# Patient Record
Sex: Male | Born: 1965 | Race: White | Hispanic: No | Marital: Married | Smoking: Current every day smoker
Health system: Southern US, Community
[De-identification: ages and names within clinical notes are randomized; demographics above are authoritative.]

## PROBLEM LIST (undated history)

## (undated) DIAGNOSIS — K30 Functional dyspepsia: Secondary | ICD-10-CM

## (undated) DIAGNOSIS — I10 Essential (primary) hypertension: Secondary | ICD-10-CM

## (undated) DIAGNOSIS — E785 Hyperlipidemia, unspecified: Secondary | ICD-10-CM

## (undated) HISTORY — DX: Hyperlipidemia, unspecified: E78.5

## (undated) HISTORY — DX: Functional dyspepsia: K30

## (undated) HISTORY — DX: Essential (primary) hypertension: I10

---

## 2017-09-26 ENCOUNTER — Emergency Department (HOSPITAL_BASED_OUTPATIENT_CLINIC_OR_DEPARTMENT_OTHER): Payer: No Typology Code available for payment source

## 2017-09-26 ENCOUNTER — Other Ambulatory Visit: Payer: Self-pay

## 2017-09-26 ENCOUNTER — Emergency Department (HOSPITAL_BASED_OUTPATIENT_CLINIC_OR_DEPARTMENT_OTHER)
Admission: EM | Admit: 2017-09-26 | Discharge: 2017-09-26 | Disposition: A | Discharge status: Home / Self Care | Payer: No Typology Code available for payment source | Attending: Emergency Medicine | Admitting: Emergency Medicine

## 2017-09-26 ENCOUNTER — Encounter (HOSPITAL_BASED_OUTPATIENT_CLINIC_OR_DEPARTMENT_OTHER): Payer: Self-pay | Admitting: *Deleted

## 2017-09-26 DIAGNOSIS — Y9389 Activity, other specified: Secondary | ICD-10-CM | POA: Diagnosis not present

## 2017-09-26 DIAGNOSIS — F17210 Nicotine dependence, cigarettes, uncomplicated: Secondary | ICD-10-CM | POA: Diagnosis not present

## 2017-09-26 DIAGNOSIS — S93402A Sprain of unspecified ligament of left ankle, initial encounter: Secondary | ICD-10-CM | POA: Diagnosis not present

## 2017-09-26 DIAGNOSIS — Y929 Unspecified place or not applicable: Secondary | ICD-10-CM | POA: Insufficient documentation

## 2017-09-26 DIAGNOSIS — Y999 Unspecified external cause status: Secondary | ICD-10-CM | POA: Diagnosis not present

## 2017-09-26 DIAGNOSIS — X500XXA Overexertion from strenuous movement or load, initial encounter: Secondary | ICD-10-CM | POA: Diagnosis not present

## 2017-09-26 DIAGNOSIS — S99912A Unspecified injury of left ankle, initial encounter: Secondary | ICD-10-CM | POA: Diagnosis present

## 2017-09-26 MED ORDER — IBUPROFEN 800 MG PO TABS
800.0000 mg | ORAL_TABLET | Freq: Once | ORAL | Status: AC
  Administered 2017-09-26: 800 mg via ORAL
  Filled 2017-09-26: qty 1

## 2017-09-26 MED ORDER — IBUPROFEN 800 MG PO TABS: 800.0000 mg | ORAL_TABLET | Freq: Three times a day (TID) | ORAL | 0 refills | Status: DC

## 2017-09-26 NOTE — ED Triage Notes (Signed)
Pt reports turning his left ankle after stepping onto trampoline last night. Denies any other injury or c/o. Left ankle is swollen, pain on with wb.

## 2017-09-26 NOTE — ED Provider Notes (Signed)
Emergency Department Provider Note   I have reviewed the triage vital signs and the nursing notes.   HISTORY  Chief Complaint Ankle Pain   HPI Lance Steele is a 52 y.o. male who was trying to help someone else off of a trampoline last night when he rolled his left ankle caused initial pain and swelling and bruising.  Ice did overnight and took some Tylenol but was persistently swollen and painful this morning so he came here for evaluation.  No other injuries. No other associated or modifying symptoms.    History reviewed. No pertinent past medical history.  There are no active problems to display for this patient.   History reviewed. No pertinent surgical history.    Allergies Patient has no known allergies.  History reviewed. No pertinent family history.  Social History Social History   Tobacco Use  . Smoking status: Current Every Day Smoker  . Smokeless tobacco: Never Used  Substance Use Topics  . Alcohol use: Not Currently  . Drug use: Not on file    Review of Systems  All other systems negative except as documented in the HPI. All pertinent positives and negatives as reviewed in the HPI. ____________________________________________   PHYSICAL EXAM:  VITAL SIGNS: ED Triage Vitals  Enc Vitals Group     BP 09/26/17 0841 (!) 136/94     Pulse Rate 09/26/17 0841 94     Resp 09/26/17 0841 18     Temp 09/26/17 0841 98 F (36.7 C)     Temp Source 09/26/17 0841 Oral     SpO2 09/26/17 0841 97 %     Weight 09/26/17 0841 193 lb (87.5 kg)     Height 09/26/17 0841 5\' 11"  (1.803 m)    Constitutional: Alert and oriented. Well appearing and in no acute distress. Eyes: Conjunctivae are normal. PERRL. EOMI. Head: Atraumatic. Nose: No congestion/rhinnorhea. Mouth/Throat: Mucous membranes are moist.  Oropharynx non-erythematous. Neck: No stridor.  No meningeal signs.   Cardiovascular: Normal rate, regular rhythm. Good peripheral circulation. Grossly normal  heart sounds.   Respiratory: Normal respiratory effort.  No retractions. Lungs CTAB. Gastrointestinal: Soft and nontender. No distention.  Musculoskeletal: Left ankle swelling with mild ecchymosis behind the lateral malleolus.  Pain with range of motion but stable.  Neurovascularly intact. Neurologic:  Normal speech and language. No gross focal neurologic deficits are appreciated.  Skin:  Skin is warm, dry and intact. No rash noted.   ____________________________________________   RADIOLOGY  Dg Ankle Complete Left  Result Date: 09/26/2017 CLINICAL DATA:  Trampoline injury with ankle pain, initial encounter EXAM: LEFT ANKLE COMPLETE - 3+ VIEW COMPARISON:  None. FINDINGS: Diffuse soft tissue swelling is noted slightly greater laterally than medially. Well corticated densities are noted adjacent to the medial and lateral malleolus consistent with prior trauma. No definitive acute fracture is seen. IMPRESSION: Considerable soft tissue swelling without acute bony abnormality. Changes of prior trauma are noted. Electronically Signed   By: Alcide Clever M.D.   On: 09/26/2017 09:19    ____________________________________________   INITIAL IMPRESSION / ASSESSMENT AND PLAN / ED COURSE  52 yo M who presents to ED with L ankle pain and swelling after fall with inversion. On exam has pain with ROM, slight swelling, pain with palpation of malleolus. Difficulty bearing weight for more than a couple steps. So XR done to evaluate for fracture and negative. No proximal fibular ttp to suggest fracture or unstable injury. No laxity in ankle to suggest severe grade 3 sprain.  Plan for ace wrap. NSAIDs for pain. Will also employ RICE therapy and weight bearing as tolerated. If symptoms not improving in one week, will plan to follow up with PCP or orthopedics for repeat imaging to evaluate for occult fracture.   I discussed with the patient the possibility of a missed fracture. I told them that sometimes they are  not seen on initial x-ray but if they had persistent pain for 5 to 7 days they would need to follow-up with their primary doctor or an orthopedic doctor to get repeat evaluation and x-ray to evaluate for missed fracture or ligamentous injury and they stated understanding. Will employ RICE therapy with NSAIDs/tylenol in mean time.   Pertinent labs & imaging results that were available during my care of the patient were reviewed by me and considered in my medical decision making (see chart for details).  ____________________________________________  FINAL CLINICAL IMPRESSION(S) / ED DIAGNOSES  Final diagnoses:  Sprain of left ankle, unspecified ligament, initial encounter     MEDICATIONS GIVEN DURING THIS VISIT:  Medications  ibuprofen (ADVIL,MOTRIN) tablet 800 mg (800 mg Oral Given 09/26/17 0903)     NEW OUTPATIENT MEDICATIONS STARTED DURING THIS VISIT:  New Prescriptions   IBUPROFEN (ADVIL,MOTRIN) 800 MG TABLET    Take 1 tablet (800 mg total) by mouth 3 (three) times daily.    Note:  This note was prepared with assistance of Dragon voice recognition software. Occasional wrong-word or sound-a-like substitutions may have occurred due to the inherent limitations of voice recognition software.   Alois Mincer, Barbara CowerJason, MD 09/26/17 [phone]

## 2018-01-07 ENCOUNTER — Ambulatory Visit
Admission: RE | Admit: 2018-01-07 | Discharge: 2018-01-07 | Disposition: A | Discharge status: Home / Self Care | Payer: No Typology Code available for payment source | Source: Ambulatory Visit | Attending: Family Medicine | Admitting: Family Medicine

## 2018-01-07 ENCOUNTER — Other Ambulatory Visit: Payer: Self-pay | Admitting: Family Medicine

## 2018-01-07 DIAGNOSIS — R05 Cough: Secondary | ICD-10-CM

## 2018-01-07 DIAGNOSIS — R053 Chronic cough: Secondary | ICD-10-CM

## 2018-03-25 ENCOUNTER — Encounter: Payer: Self-pay | Admitting: Internal Medicine

## 2018-03-25 ENCOUNTER — Ambulatory Visit (INDEPENDENT_AMBULATORY_CARE_PROVIDER_SITE_OTHER): Payer: No Typology Code available for payment source | Admitting: Internal Medicine

## 2018-03-25 VITALS — BP 114/80 | HR 90 | Ht 70.0 in | Wt 199.0 lb

## 2018-03-25 DIAGNOSIS — F17210 Nicotine dependence, cigarettes, uncomplicated: Secondary | ICD-10-CM | POA: Diagnosis not present

## 2018-03-25 DIAGNOSIS — J45991 Cough variant asthma: Secondary | ICD-10-CM | POA: Diagnosis not present

## 2018-03-25 MED ORDER — BUDESONIDE-FORMOTEROL FUMARATE 160-4.5 MCG/ACT IN AERO: 2.0000 | INHALATION_SPRAY | Freq: Two times a day (BID) | RESPIRATORY_TRACT | 11 refills | Status: DC

## 2018-03-25 MED ORDER — BUDESONIDE-FORMOTEROL FUMARATE 160-4.5 MCG/ACT IN AERO: 2.0000 | INHALATION_SPRAY | Freq: Two times a day (BID) | RESPIRATORY_TRACT | 0 refills | Status: DC

## 2018-03-25 NOTE — Assessment & Plan Note (Signed)
Counseled re importance of smoking cessation but did not meet time criteria for separate billing       I reviewed the Fletcher curve with the patient that basically indicates  if you quit smoking when your best day FEV1 is still well preserved (as is clearly  the case here)  it is highly unlikely you will progress to severe disease and informed the patient there was  no medication on the market that has proven to alter the curve/ its downward trajectory  or the likelihood of progression of their disease(unlike other chronic medical conditions such as atheroclerosis where we do think we can change the natural hx with risk reducing meds)    Therefore stopping smoking and maintaining abstinence are  the most important aspects of his care, not choice of inhalers or for that matter, doctors.   Treatment other than smoking cessation  is entirely directed by severity of symptoms and focused also on reducing exacerbations, not attempting to change the natural history of the disease.    See cough variant asthma for treating his intermittent symptoms  >>> Pulmonary f/u can be prn     Total time devoted to counseling  > 50 % of initial 60 min office visit:  review case with pt/ discussion of options/alternatives/ personally creating written customized instructions  in presence of pt  then going over those specific  Instructions directly with the pt including how to use all of the meds but in particular covering each new medication in detail and the difference between the maintenance= "automatic" meds and the prns using an action plan format for the latter (If this problem/symptom => do that organization reading Left to right).  Please see AVS from this visit for a full list of these instructions which I personally wrote for this pt and  are unique to this visit.   See device teaching which extended face to face time for this visit

## 2018-03-25 NOTE — Assessment & Plan Note (Signed)
03/25/2018  After extensive coaching inhaler device,  effectiveness =    90% >  Symbicort 160  2 bid prn   Based on two studies from NEJM  378; 20 p 1865 (2018) and 380 : p2020-30 (2019) in pts with mild asthma it is reasonable to use low dose symbicort eg 80 2bid "prn" flare in this setting but I emphasized this was only shown with symbicort and takes advantage of the rapid onset of action but is not the same as "rescue therapy" but can be stopped once the acute symptoms have resolved and the need for rescue has been minimized (< 2 x weekly)   Since he's still smoking and wants to use a prn reasonable to use symb 160 initially but if does great than change to the 80 2bid   - other option is just to stay on breo as maint.

## 2018-03-25 NOTE — Progress Notes (Signed)
Lance BurnJohn Venard, male    DOB: [birth year],    MRN: [id number]   Brief patient profile:  [age] yowm active smoker with new abrupt inhaled dust while mowing late Aug /early sept 2019 with   coughing and subj wheeze and nasal congestion transiently - breathing briefly better on albuterol then started on Breo but p completed the sample noted same symptoms> Breo restarted and referred to pulmonary clinic 03/25/2018 by Dr  Erling ConteSwain with nl spirometry on initial eval p Breo w/in 12 h of study      History of Present Illness  03/25/2018  Pulmonary/ 1st office eval/Chrisa Hassan on Breo maint rx  Chief Complaint  Patient presents with  . Pulmonary Consult    Referred by Dr. Azucena CecilSwayne. Pt c/o cough and wheezing off and on since Summer 2019- started after mowing his lawn.   Dyspnea:  None now  Cough: none now  Sleep: fine / bed flat/ on side  SABA use: none    No obvious ongoing  day to day or daytime variability or assoc excess/ purulent sputum or mucus plugs or hemoptysis or cp or chest tightness, subjective wheeze or overt sinus or hb symptoms.   Sleeping as above  without nocturnal  or early am exacerbation  of respiratory  c/o's or need for noct saba. Also denies any obvious fluctuation of symptoms with weather or environmental changes or other aggravating or alleviating factors except as outlined above   No unusual exposure hx or h/o childhood pna/ asthma or knowledge of premature birth.  Current Allergies, Complete Past Medical History, Past Surgical History, Family History, and Social History were reviewed in [facility] electronic medical record.  ROS  The following are not active complaints unless bolded Hoarseness, sore throat, dysphagia, dental problems, itching, sneezing,  nasal congestion or discharge of excess mucus or purulent secretions, ear ache,   fever, chills, sweats, unintended wt loss or wt gain, classically pleuritic or exertional cp,  orthopnea pnd or arm/hand swelling  or leg swelling,  presyncope, palpitations, abdominal pain, anorexia, nausea, vomiting, diarrhea  or change in bowel habits or change in bladder habits, change in stools or change in urine, dysuria, hematuria,  rash, arthralgias, visual complaints, headache, numbness, weakness or ataxia or problems with walking or coordination,  change in mood or  memory.             No past medical history on file.  Outpatient Medications Prior to Visit  Medication Sig Dispense Refill  . Fluticasone Furoate-Vilanterol (BREO ELLIPTA IN) Inhale 1 puff into the lungs daily. Unsure of strength    . ibuprofen (ADVIL,MOTRIN) 800 MG tablet Take 1 tablet (800 mg total) by mouth 3 (three) times daily. 21 tablet 0   No facility-administered medications prior to visit.      Objective:     BP 114/80 (BP Location: Left Arm, Cuff Size: Normal)   Pulse 90   Ht 5\' 10"  (1.778 m)   Wt 199 lb (90.3 kg)   SpO2 97%   BMI 28.55 kg/m   SpO2: 97 %  RA   HEENT: nl dentition,   and oropharynx. Nl external ear canals without cough reflex - mild  bilateral non-specific turbinate edema     NECK :  without JVD/Nodes/TM/ nl carotid upstrokes bilaterally   LUNGS: no acc muscle use,  Nl contour chest which is clear to A and P bilaterally without cough on insp or exp maneuvers   CV:  RRR  no s3 or murmur or  increase in P2, and no edema   ABD:  soft and nontender with nl inspiratory excursion in the supine position. No bruits or organomegaly appreciated, bowel sounds nl  MS:  Nl gait/ ext warm without deformities, calf tenderness, cyanosis or clubbing No obvious joint restrictions   SKIN: warm and dry without lesions    NEURO:  alert, approp, nl sensorium with  no motor or cerebellar deficits apparent.     I personally reviewed images and agree with radiology impression as follows:  CXR:   01/07/18  No active cardiopulmonary disease.      Assessment   Cough variant asthma 03/25/2018  After extensive coaching inhaler device,   effectiveness =    90% >  Symbicort 160  2 bid prn   Based on two studies from NEJM  378; 20 p 1865 (2018) and 380 : p2020-30 (2019) in pts with mild asthma it is reasonable to use low dose symbicort eg 80 2bid "prn" flare in this setting but I emphasized this was only shown with symbicort and takes advantage of the rapid onset of action but is not the same as "rescue therapy" but can be stopped once the acute symptoms have resolved and the need for rescue has been minimized (< 2 x weekly)   Since he's still smoking and wants to use a prn reasonable to use symb 160 initially but if does great than change to the 80 2bid   - other option is just to stay on breo as maint.      Cigarette smoker Counseled re importance of smoking cessation but did not meet time criteria for separate billing    I reviewed the Fletcher curve with the patient that basically indicates  if you quit smoking when your best day FEV1 is still well preserved (as is clearly  the case here)  it is highly unlikely you will progress to severe disease and informed the patient there was  no medication on the market that has proven to alter the curve/ its downward trajectory  or the likelihood of progression of their disease(unlike other chronic medical conditions such as atheroclerosis where we do think we can change the natural hx with risk reducing meds)    Therefore stopping smoking and maintaining abstinence are  the most important aspects of his care, not choice of inhalers or for that matter, doctors.   Treatment other than smoking cessation  is entirely directed by severity of symptoms and focused also on reducing exacerbations, not attempting to change the natural history of the disease.    See cough variant asthma for treating his intermittent symptoms  >>> Pulmonary f/u can be prn       Total time devoted to counseling  > 50 % of initial 60 min office visit:  review case with pt/ discussion of options/alternatives/  personally creating written customized instructions  in presence of pt  then going over those specific  Instructions directly with the pt including how to use all of the meds but in particular covering each new medication in detail and the difference between the maintenance= "automatic" meds and the prns using an action plan format for the latter (If this problem/symptom => do that organization reading Left to right).  Please see AVS from this visit for a full list of these instructions which I personally wrote for this pt and  are unique to this visit.   See device teaching which extended face to face time for this visit  Sandrea Hughs, MD 03/25/2018

## 2018-03-25 NOTE — Patient Instructions (Signed)
IF having any cough/ wheeze or short of breath ok to take symbicort 160  Up to  2 puffs every 12 hours     Work on inhaler technique:  relax and gently blow all the way out then take a nice smooth deep breath back in, triggering the inhaler at same time you start breathing in.  Hold for up to 5 seconds if you can. Blow out thru nose. Rinse and gargle with water when done  Pulmonary follow up is as needed

## 2019-05-07 ENCOUNTER — Telehealth: Payer: Self-pay | Admitting: Internal Medicine

## 2019-05-07 NOTE — Telephone Encounter (Signed)
Our last ov documented that he was actively smoking but strongly rec he quit 03/2018.   congrats if he quit but all I can give the insurance company is copy of last ov.  If there was a subsequent encounter (including phone note or ov with another provider) I suggest he use that as "ammunition"

## 2019-05-07 NOTE — Telephone Encounter (Signed)
Called and spoke to pt. Pt is requesting a letter signed by Dr. Sherene Sires stating he stopped smoking in Jan 2020, this is for pt's life insurance policy. Pt requesting a call back once complete, would possibly like letter faxed.   Dr. Sherene Sires please advise. Thanks.

## 2019-05-08 NOTE — Telephone Encounter (Signed)
Called the patient and made him aware he has not been seen in clinic since 2019. The letter would have to be based on current information in chart.  Patient agreed to appointment on 05/26/19 with Dr. Sherene Sires. Letter to be issued at that time. Nothing further needed.

## 2019-05-26 ENCOUNTER — Ambulatory Visit: Payer: No Typology Code available for payment source | Admitting: Internal Medicine

## 2020-07-29 ENCOUNTER — Encounter (HOSPITAL_COMMUNITY): Payer: Self-pay

## 2020-07-29 ENCOUNTER — Emergency Department (HOSPITAL_COMMUNITY): Payer: Managed Care, Other (non HMO)

## 2020-07-29 ENCOUNTER — Emergency Department (HOSPITAL_COMMUNITY)
Admission: EM | Admit: 2020-07-29 | Discharge: 2020-07-30 | Disposition: A | Discharge status: Home / Self Care | Payer: Managed Care, Other (non HMO) | Attending: Emergency Medicine | Admitting: Emergency Medicine

## 2020-07-29 DIAGNOSIS — S060X1A Concussion with loss of consciousness of 30 minutes or less, initial encounter: Secondary | ICD-10-CM | POA: Insufficient documentation

## 2020-07-29 DIAGNOSIS — W228XXA Striking against or struck by other objects, initial encounter: Secondary | ICD-10-CM | POA: Diagnosis not present

## 2020-07-29 DIAGNOSIS — S0990XA Unspecified injury of head, initial encounter: Secondary | ICD-10-CM | POA: Diagnosis present

## 2020-07-29 LAB — COMPREHENSIVE METABOLIC PANEL
ALT: 22 U/L (ref 0–44)
AST: 23 U/L (ref 15–41)
Albumin: 4.1 g/dL (ref 3.5–5.0)
Alkaline Phosphatase: 40 U/L (ref 38–126)
Anion gap: 12 (ref 5–15)
BUN: 19 mg/dL (ref 6–20)
CO2: 18 mmol/L — ABNORMAL LOW (ref 22–32)
Calcium: 8.8 mg/dL — ABNORMAL LOW (ref 8.9–10.3)
Chloride: 104 mmol/L (ref 98–111)
Creatinine, Ser: 1.17 mg/dL (ref 0.61–1.24)
GFR, Estimated: >60 mL/min (ref >60)
Glucose, Bld: 154 mg/dL — ABNORMAL HIGH (ref 70–99)
Potassium: 3.6 mmol/L (ref 3.5–5.1)
Sodium: 134 mmol/L — ABNORMAL LOW (ref 135–145)
Total Bilirubin: 0.6 mg/dL (ref 0.3–1.2)
Total Protein: 6.4 g/dL — ABNORMAL LOW (ref 6.5–8.1)

## 2020-07-29 LAB — CBC WITH DIFFERENTIAL/PLATELET
Abs Immature Granulocytes: 0.06 10*3/uL (ref 0.00–0.07)
Basophils Absolute: 0.1 10*3/uL (ref 0.0–0.1)
Basophils Relative: 0 %
Eosinophils Absolute: 0.5 10*3/uL (ref 0.0–0.5)
Eosinophils Relative: 4 %
HCT: 44 % (ref 39.0–52.0)
Hemoglobin: 14.8 g/dL (ref 13.0–17.0)
Immature Granulocytes: 1 %
Lymphocytes Relative: 31 %
Lymphs Abs: 3.8 10*3/uL (ref 0.7–4.0)
MCH: 29 pg (ref 26.0–34.0)
MCHC: 33.6 g/dL (ref 30.0–36.0)
MCV: 86.3 fL (ref 80.0–100.0)
Monocytes Absolute: 0.7 10*3/uL (ref 0.1–1.0)
Monocytes Relative: 6 %
Neutro Abs: 7.1 10*3/uL (ref 1.7–7.7)
Neutrophils Relative %: 58 %
Platelets: 271 10*3/uL (ref 150–400)
RBC: 5.1 MIL/uL (ref 4.22–5.81)
RDW: 13.2 % (ref 11.5–15.5)
WBC: 12.2 10*3/uL — ABNORMAL HIGH (ref 4.0–10.5)
nRBC: 0 % (ref 0.0–0.2)

## 2020-07-29 LAB — TROPONIN I (HIGH SENSITIVITY): Troponin I (High Sensitivity): 3 ng/L (ref <18)

## 2020-07-29 MED ORDER — ONDANSETRON HCL 4 MG/2ML IJ SOLN
4.0000 mg | Freq: Once | INTRAMUSCULAR | Status: AC
  Administered 2020-07-29: 4 mg via INTRAVENOUS

## 2020-07-29 MED ORDER — ONDANSETRON 4 MG PO TBDP: 4.0000 mg | ORAL_TABLET | Freq: Three times a day (TID) | ORAL | 0 refills | Status: DC | PRN

## 2020-07-29 MED ORDER — ONDANSETRON HCL 4 MG/2ML IJ SOLN
INTRAMUSCULAR | Status: AC
  Filled 2020-07-29: qty 2
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
## 2020-07-29 NOTE — ED Notes (Signed)
Addendum: Patient actually roomed @ 20:25; pt inadvertently roomed prior to arriving (roomed at 20:12, actual arrival time 20:25).

## 2020-07-29 NOTE — ED Notes (Signed)
Pt ambulated in hallway and given water, tolerating well

## 2020-07-29 NOTE — ED Provider Notes (Signed)
[facility] EMERGENCY DEPARTMENT Provider Note   CSN: [id number] Arrival date & time: 07/29/20  2012     History Chief Complaint  Patient presents with  . Head Injury    Lance Steele is a 55 y.o. male.  The history is provided by the patient and medical records.   Lance Steele is a 55 y.o. male who presents to the Emergency Department complaining of head injury. He presents the emergency department as a level I trauma alert after being struck in the head. He was standing in the driveway when a limb fell from a height of about 60 feet and struck him in the head. Shortly after being struck he slowly buckled to the ground. A little bit later he did have two episodes of loss of consciousness, between 30 seconds to a minute. He did have a couple of episodes of emesis as well. He did drink one and a half beer today. On ED arrival he reports feeling well. On EMS arrival he had a blood pressure of 84 systolic.    History reviewed. No pertinent past medical history.  There are no problems to display for this patient.   History reviewed. No pertinent surgical history.     No family history on file.     Home Medications Prior to Admission medications   Medication Sig Start Date End Date Taking? Authorizing Provider  ondansetron (ZOFRAN ODT) 4 MG disintegrating tablet Take 1 tablet (4 mg total) by mouth every 8 (eight) hours as needed for nausea or vomiting. 07/29/20  Yes Tilden Fossa, MD    Allergies    Patient has no known allergies.  Review of Systems   Review of Systems  All other systems reviewed and are negative.   Physical Exam Updated Vital Signs BP 135/84   Pulse 98   Temp (!) 96.9 F (36.1 C)   Resp 13   Ht 5\' 10"  (1.778 m)   Wt 87.1 kg   SpO2 99%   BMI 27.55 kg/m   Physical Exam Vitals and nursing note reviewed.  Constitutional:      Appearance: He is well-developed.  HENT:     Head: Normocephalic and atraumatic.  Cardiovascular:      Rate and Rhythm: Normal rate and regular rhythm.     Heart sounds: No murmur heard.   Pulmonary:     Effort: Pulmonary effort is normal. No respiratory distress.     Breath sounds: Normal breath sounds.  Abdominal:     Palpations: Abdomen is soft.     Tenderness: There is no abdominal tenderness. There is no guarding or rebound.  Musculoskeletal:        General: No tenderness.     Comments: 2+ DP pulses bilaterally. Abrasion to left posterior shoulder.  Skin:    General: Skin is warm and dry.  Neurological:     Mental Status: He is alert and oriented to person, place, and time.     Comments: Five out of five strength in all four extremities  Psychiatric:        Behavior: Behavior normal.     ED Results / Procedures / Treatments   Labs (all labs ordered are listed, but only abnormal results are displayed) Labs Reviewed  COMPREHENSIVE METABOLIC PANEL - Abnormal; Notable for the following components:      Result Value   Sodium 134 (*)    CO2 18 (*)    Glucose, Bld 154 (*)    Calcium 8.8 (*)  Total Protein 6.4 (*)    All other components within normal limits  CBC WITH DIFFERENTIAL/PLATELET - Abnormal; Notable for the following components:   WBC 12.2 (*)    All other components within normal limits  TROPONIN I (HIGH SENSITIVITY)  TROPONIN I (HIGH SENSITIVITY)    EKG None  Radiology CT Head Wo Contrast  Result Date: 07/29/2020 CLINICAL DATA:  Head trauma. Hit with a tree branch. Loss of consciousness and vomiting. EXAM: CT HEAD WITHOUT CONTRAST CT CERVICAL SPINE WITHOUT CONTRAST TECHNIQUE: Multidetector CT imaging of the head and cervical spine was performed following the standard protocol without intravenous contrast. Multiplanar CT image reconstructions of the cervical spine were also generated. COMPARISON:  None. FINDINGS: CT HEAD FINDINGS Brain: There is no evidence of an acute infarct, intracranial hemorrhage, mass, midline shift, or extra-axial fluid collection.  The ventricles and sulci are normal. Vascular: No suspicious focal vessel hyperdensity. Skull: No fracture or suspicious osseous lesion. Sinuses/Orbits: Mild mucosal thickening in the maxillary sinuses. Clear mastoid air cells. Unremarkable orbits. Other: None. CT CERVICAL SPINE FINDINGS Alignment: Cervical spine straightening.  No listhesis. Skull base and vertebrae: No acute fracture or suspicious osseous lesion. Mild median C1-2 arthropathy. Soft tissues and spinal canal: No prevertebral fluid or swelling. No visible canal hematoma. Disc levels: Mild disc degeneration at C4-5 and C6-7. Severe right facet arthrosis at C7-T1. No evidence of high-grade spinal canal or neural foraminal stenosis. Upper chest: Clear lung apices. Other: None. IMPRESSION: 1. No evidence of acute intracranial abnormality. 2. No evidence of acute fracture or traumatic subluxation in the cervical spine. Electronically Signed   By: Sebastian Ache M.D.   On: 07/29/2020 20:58   CT Cervical Spine Wo Contrast  Result Date: 07/29/2020 CLINICAL DATA:  Head trauma. Hit with a tree branch. Loss of consciousness and vomiting. EXAM: CT HEAD WITHOUT CONTRAST CT CERVICAL SPINE WITHOUT CONTRAST TECHNIQUE: Multidetector CT imaging of the head and cervical spine was performed following the standard protocol without intravenous contrast. Multiplanar CT image reconstructions of the cervical spine were also generated. COMPARISON:  None. FINDINGS: CT HEAD FINDINGS Brain: There is no evidence of an acute infarct, intracranial hemorrhage, mass, midline shift, or extra-axial fluid collection. The ventricles and sulci are normal. Vascular: No suspicious focal vessel hyperdensity. Skull: No fracture or suspicious osseous lesion. Sinuses/Orbits: Mild mucosal thickening in the maxillary sinuses. Clear mastoid air cells. Unremarkable orbits. Other: None. CT CERVICAL SPINE FINDINGS Alignment: Cervical spine straightening.  No listhesis. Skull base and vertebrae: No  acute fracture or suspicious osseous lesion. Mild median C1-2 arthropathy. Soft tissues and spinal canal: No prevertebral fluid or swelling. No visible canal hematoma. Disc levels: Mild disc degeneration at C4-5 and C6-7. Severe right facet arthrosis at C7-T1. No evidence of high-grade spinal canal or neural foraminal stenosis. Upper chest: Clear lung apices. Other: None. IMPRESSION: 1. No evidence of acute intracranial abnormality. 2. No evidence of acute fracture or traumatic subluxation in the cervical spine. Electronically Signed   By: Sebastian Ache M.D.   On: 07/29/2020 20:58   DG Chest Port 1 View  Result Date: 07/29/2020 CLINICAL DATA:  Trauma.  Tree fell on head and shoulder. EXAM: PORTABLE CHEST 1 VIEW COMPARISON:  None. FINDINGS: Shallow inspiration. Heart size and pulmonary vascularity are normal. Lungs are clear. No pleural effusions. No pneumothorax. Mediastinal contours appear intact. Visualized ribs are nondisplaced. IMPRESSION: No active disease. Electronically Signed   By: Burman Nieves M.D.   On: 07/29/2020 20:46    Procedures Procedures  Medications Ordered in ED Medications  ondansetron (ZOFRAN) injection 4 mg (4 mg Intravenous Given 07/29/20 2048)    ED Course  I have reviewed the triage vital signs and the nursing notes.  Pertinent labs & imaging results that were available during my care of the patient were reviewed by me and considered in my medical decision making (see chart for details).    MDM Rules/Calculators/A&P                         patient here for evaluation of injuries following a tree landing on his head. He was initially a level I trauma alert due to hypertension prior to ED arrival. On ED assessment he was downgraded to a level II trauma alert. Hypotensive episode likely secondary to vagal response from head injury. Patient GCS of 15 during his ED stay. Images are negative for acute abnormality. He was able to eat without difficulty and ambulate the  department. Discussed with patient home care for concussion with return precautions.  Final Clinical Impression(s) / ED Diagnoses Final diagnoses:  Concussion with loss of consciousness of 30 minutes or less, initial encounter  Injury of head, initial encounter    Rx / DC Orders ED Discharge Orders         Ordered    ondansetron (ZOFRAN ODT) 4 MG disintegrating tablet  Every 8 hours PRN        07/29/20 2243           Tilden Fossa, MD 07/29/20 2348

## 2021-06-13 ENCOUNTER — Ambulatory Visit: Payer: Self-pay | Admitting: Cardiology

## 2021-06-27 NOTE — Progress Notes (Signed)
?Cardiology Office Note:   ? ?Date:  06/28/2021  ? ?ID:  Lance Steele, DOB 03-Jul-1965, MRN [id number] ? ?PCP:  Etta Quill, PA-C  ? ?[facility] Providers ?Cardiologist:  Alverda Skeans, MD ?Referring MD: Lance Joe, MD  ? ?Chief Complaint/Reason for Referral: Dyspnea on exertion ? ?ASSESSMENT:   ? ?1. Dyspnea on exertion   ? ? ?PLAN:   ? ?In order of problems listed above: ?1.  He had an isolated episode of dyspnea.  I am not exactly sure what the etiology of this is.  He has had no exertional angina and his EKG is without abnormalities.  We will obtain an echocardiogram to evaluate further.  Follow-up in 6 months or earlier if needed. ? ? ?Dispo:  Return in about 6 months (around 12/29/2021).  ? ?  ? ?Medication Adjustments/Labs and Tests Ordered: ?Current medicines are reviewed at length with the patient today.  Concerns regarding medicines are outlined above.  ? ?Tests Ordered: ?Orders Placed This Encounter  ?Procedures  ? EKG 12-Lead  ? ECHOCARDIOGRAM COMPLETE  ? ? ?Medication Changes: ?No orders of the defined types were placed in this encounter. ? ? ?History of Present Illness:   ? ?FOCUSED PROBLEM LIST:   ?1.  Dyslipidemia ?2.  Former smoker ?3.  Hypertension ? ?The patient is a 56 y.o. male with the indicated medical history here for recommendations regarding dyspnea on exertion.  The patient tells me several weeks ago he had an episode where he was bending over to pick up branches and he felt pretty short of breath.  He denies any exertional angina however.  This has not really recurred since that time.  He denies any peripheral edema or paroxysmal nocturnal dyspnea.  He has had no signs or symptoms of stroke or any severe bleeding.  He denies any shortness of breath in his day-to-day activities since then.  Of note he has had some wheezing when he lays down.  His PCP thought this may be related to GERD or allergies and he was prescribed Benadryl and Protonix which has helped.  He has required no  emergency room visits or hospitalizations.  He is otherwise well without complaints. ? ? ?Current Medications: ?Current Meds  ?Medication Sig  ? pantoprazole (PROTONIX) 40 MG tablet Take 40 mg by mouth daily.  ?  ? ?Allergies:    ?Patient has no known allergies.  ? ?Social History:   ?Social History  ? ?Tobacco Use  ? Smoking status: Every Day  ?  Packs/day: 1.00  ?  Years: 33.00  ?  Pack years: 33.00  ?  Types: Cigarettes  ? Smokeless tobacco: Never  ?Vaping Use  ? Vaping Use: Never used  ?Substance Use Topics  ? Alcohol use: Not Currently  ?  ? ?Family Hx: ?History reviewed. No pertinent family history.  ? ?Review of Systems:   ?Please see the history of present illness.    ?All other systems reviewed and are negative. ?  ? ? ?EKGs/Labs/Other Test Reviewed:   ? ?EKG: April 2022 sinus rhythm with anteroseptal MI pattern ? ?Prior CV studies: ?No previous ?  ? ?Imaging studies that I have independently reviewed today: Head CT and chest x-ray ? ?Recent Labs: ?07/29/2020: ALT 22; BUN 19; Creatinine, Ser 1.17; Hemoglobin 14.8; Platelets 271; Potassium 3.6; Sodium 134  ? ?External labs demonstrate hemoglobin 15.8, platelets 261, sodium 140 potassium 4.4, creatinine 0.98, TSH 0.96, cholesterol 182, triglycerides 151, HDL 49, and LDL 111 ? ?Risk Assessment/Calculations:   ? ? ?    ? ?  Physical Exam:   ? ?VS:  BP 126/82   Pulse 84   Ht 5\' 10"  (1.778 m)   Wt 202 lb 12.8 oz (92 kg)   SpO2 98%   BMI 29.10 kg/m?    ?Wt Readings from Last 3 Encounters:  ?06/28/21 202 lb 12.8 oz (92 kg)  ?07/29/20 192 lb (87.1 kg)  ?03/25/18 199 lb (90.3 kg)  ?  ?GENERAL:  No apparent distress, AOx3 ?HEENT:  No carotid bruits, +2 carotid impulses, no scleral icterus ?CAR: RRR no murmurs, gallops, rubs, or thrills ?RES:  Clear to auscultation bilaterally ?ABD:  Soft, nontender, nondistended, positive bowel sounds x 4 ?VASC:  +2 radial pulses, +2 carotid pulses, palpable pedal pulses ?NEURO:  CN 2-12 grossly intact; motor and sensory grossly  intact ?PSYCH:  No active depression or anxiety ?EXT:  No edema, ecchymosis, or cyanosis ? ?Signed, ?03/27/18, MD  ?06/28/2021 9:22 AM    ?[facility] ?[address] [place], [place], [place]  [place] ?Phone: [phone]; Fax: [phone]  ? ?Note:  This document was prepared using Dragon voice recognition software and may include unintentional dictation errors. ?

## 2021-06-28 ENCOUNTER — Other Ambulatory Visit: Payer: Self-pay

## 2021-06-28 ENCOUNTER — Ambulatory Visit (INDEPENDENT_AMBULATORY_CARE_PROVIDER_SITE_OTHER): Payer: Managed Care, Other (non HMO) | Admitting: Internal Medicine

## 2021-06-28 ENCOUNTER — Encounter: Payer: Self-pay | Admitting: Internal Medicine

## 2021-06-28 VITALS — BP 126/82 | HR 84 | Ht 70.0 in | Wt 202.8 lb

## 2021-06-28 DIAGNOSIS — R0609 Other forms of dyspnea: Secondary | ICD-10-CM | POA: Diagnosis not present

## 2021-06-28 NOTE — Patient Instructions (Addendum)
Medication Instructions:  ?No changes ?*If you need a refill on your cardiac medications before your next appointment, please call your pharmacy* ? ? ?Lab Work: ?none ? ? ?Testing/Procedures: ?Your physician has requested that you have an echocardiogram. Echocardiography is a painless test that uses sound waves to create images of your heart. It provides your doctor with information about the size and shape of your heart and how well your heart?s chambers and valves are working. This procedure takes approximately one hour. There are no restrictions for this procedure. ? ? ?Follow-Up: ?6 months with Dr. Lynnette Caffey ? ?Other Instructions ?  ?

## 2021-07-05 ENCOUNTER — Ambulatory Visit (HOSPITAL_COMMUNITY): Payer: Managed Care, Other (non HMO) | Attending: Cardiology

## 2021-07-05 ENCOUNTER — Encounter: Payer: Self-pay | Admitting: Internal Medicine

## 2021-07-05 ENCOUNTER — Other Ambulatory Visit: Payer: Self-pay

## 2021-07-05 DIAGNOSIS — R0609 Other forms of dyspnea: Secondary | ICD-10-CM

## 2021-07-05 LAB — ECHOCARDIOGRAM COMPLETE
Area-P 1/2: 2.82 cm2
S' Lateral: 3 cm

## 2021-12-31 NOTE — Progress Notes (Signed)
Office Visit    Patient Name: Lance Steele Date of Encounter: 01/01/2022  Primary Care Provider:  Fransisca Connors, PA-C Primary Cardiologist:  None Primary Electrophysiologist: None  Chief Complaint    Lance Steele is a 56 y.o. male with PMH of dyspnea, HTN, HLD, former tobacco user who presents today for follow-up of dyspnea on exertion.  Past Medical History    Past Medical History:  Diagnosis Date   Dyslipidemia    HTN (hypertension)    Indigestion    History reviewed. No pertinent surgical history.  Allergies  No Known Allergies  History of Present Illness    Lance Steele  is a 56year old male with the above mention past medical history who presents today for complaint of dyspnea on exertion.  Mr. [name] was initially seen by Dr.Thukkani on 06/2021 for complaint of dyspnea on exertion.  Patient denied exertional angina or shortness of breath with ADLs.  He was noted of having wheezing when lying flat.  Patient had a 2D echo completed 06/2021 with EF of 60-65%, no RWMA, normal LV function with normal valvular structure and function.  His blood pressures were within normal limits during visit.  He was referred to his PCP for follow-up with pulmonology for pulmonary function testing.   Mr. [name] presents today for 91-month follow-up.  Since last being seen in the office patient reports no shortness of breath or chest discomfort.  He reports that his shortness of breath with lying flat has resolved since starting Protonix.  He is currently planning to get back into exercise routine and has purchased a treadmill.  He is also planning to start a walking routine to increase his physical activity daily.  He feels that his shortness of breath is primarily related to deconditioning.  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  He is also not smoking and states that he stopped in 2022.  During the visit we reviewed his 2D echo results and all questions were  answered to patient's satisfaction.  Patient denies chest pain, palpitations, dyspnea, PND, orthopnea, nausea, vomiting, dizziness, syncope, edema, weight gain, or early satiety.   Home Medications    Current Outpatient Medications  Medication Sig Dispense Refill   pantoprazole (PROTONIX) 40 MG tablet Take 40 mg by mouth daily.     No current facility-administered medications for this visit.     Review of Systems  Please see the history of present illness.     All other systems reviewed and are otherwise negative except as noted above.  Physical Exam    Wt Readings from Last 3 Encounters:  01/01/22 208 lb (94.3 kg)  06/28/21 202 lb 12.8 oz (92 kg)  07/29/20 192 lb (87.1 kg)   VS: Vitals:   01/01/22 0917  BP: 122/76  Pulse: 87  SpO2: 97%  ,Body mass index is 29.84 kg/m.  Constitutional:      Appearance: Healthy appearance. Not in distress.  Neck:     Vascular: JVD normal.  Pulmonary:     Effort: Pulmonary effort is normal.     Breath sounds: No wheezing. No rales. Diminished in the bases Cardiovascular:     Normal rate. Regular rhythm. Normal S1. Normal S2.      Murmurs: There is no murmur.  Edema:    Peripheral edema absent.  Abdominal:     Palpations: Abdomen is soft non tender. There is no hepatomegaly.  Skin:    General: Skin is warm and dry.  Neurological:     General: No focal deficit present.     Mental Status: Alert and oriented to person, place and time.     Cranial Nerves: Cranial nerves are intact.  EKG/LABS/Other Studies Reviewed    ECG personally reviewed by me today -none completed   Lab Results  Component Value Date   WBC 12.2 (H) 07/29/2020   HGB 14.8 07/29/2020   HCT 44.0 07/29/2020   MCV 86.3 07/29/2020   PLT 271 07/29/2020   Lab Results  Component Value Date   CREATININE 1.17 07/29/2020   BUN 19 07/29/2020   NA 134 (L) 07/29/2020   K 3.6 07/29/2020   CL 104 07/29/2020   CO2 18 (L) 07/29/2020   Lab Results  Component Value  Date   ALT 22 07/29/2020   AST 23 07/29/2020   ALKPHOS 40 07/29/2020   BILITOT 0.6 07/29/2020   No results found for: "CHOL", "HDL", "LDLCALC", "LDLDIRECT", "TRIG", "CHOLHDL"  No results found for: "HGBA1C"  Assessment & Plan    1.  Dyspnea on exertion: -Today patient reports that his shortness of breath has resolved and is also able to lay flat without shortness of breath. -He was started on Protonix by his PCP which he reports has helped with his dyspnea with lying flat.  2.  Hypertension: -Patient's blood pressure today was well controlled at 122/76  3.  Hyperlipidemia: -Patient's lipids currently followed by PCP  4.  History of tobacco abuse: -Patient reports that he stopped smoking in 2022 and has tolerated without any relapse.  Disposition: Follow-up as needed   Medication Adjustments/Labs and Tests Ordered: Current medicines are reviewed at length with the patient today.  Concerns regarding medicines are outlined above.   Signed, Mable Fill, Marissa Nestle, NP 01/01/2022, 9:44 AM [place]

## 2022-01-01 ENCOUNTER — Ambulatory Visit: Payer: Managed Care, Other (non HMO) | Attending: Nurse Practitioner | Admitting: Nurse Practitioner

## 2022-01-01 ENCOUNTER — Encounter: Payer: Self-pay | Admitting: Nurse Practitioner

## 2022-01-01 VITALS — BP 122/76 | HR 87 | Ht 70.0 in | Wt 208.0 lb

## 2022-01-01 DIAGNOSIS — E785 Hyperlipidemia, unspecified: Secondary | ICD-10-CM | POA: Diagnosis not present

## 2022-01-01 DIAGNOSIS — I10 Essential (primary) hypertension: Secondary | ICD-10-CM | POA: Diagnosis not present

## 2022-01-01 DIAGNOSIS — R0609 Other forms of dyspnea: Secondary | ICD-10-CM

## 2022-01-01 NOTE — Patient Instructions (Signed)
Medication Instructions:   Your physician recommends that you continue on your current medications as directed. Please refer to the Current Medication list given to you today.   *If you need a refill on your cardiac medications before your next appointment, please call your pharmacy*   Lab Work:  None ordered.  If you have labs (blood work) drawn today and your tests are completely normal, you will receive your results only by: [place] (if you have MyChart) OR A paper copy in the mail If you have any lab test that is abnormal or we need to change your treatment, we will call you to review the results.   Testing/Procedures:  None ordered.   Follow-Up: At [facility], you and your health needs are our priority.  As part of our continuing mission to provide you with exceptional heart care, we have created designated Provider Care Teams.  These Care Teams include your primary Cardiologist (physician) and Advanced Practice Providers (APPs -  Physician Assistants and Nurse Practitioners) who all work together to provide you with the care you need, when you need it.  We recommend signing up for the patient portal called "MyChart".  Sign up information is provided on this After Visit Summary.  MyChart is used to connect with patients for Virtual Visits (Telemedicine).  Patients are able to view lab/test results, encounter notes, upcoming appointments, etc.  Non-urgent messages can be sent to your provider as well.   To learn more about what you can do with MyChart, go to [email or web address].    Your next appointment:   As needed.   The format for your next appointment:     Provider:   As needed.         Important Information About Sugar

## 2022-09-07 ENCOUNTER — Telehealth: Payer: Self-pay | Admitting: Internal Medicine

## 2022-09-07 NOTE — Telephone Encounter (Signed)
Pt is requesting OV Notes, Labs and Tests Results from 03/25/2018. Pt is needing this for Life insurance purposes

## 2022-09-10 NOTE — Telephone Encounter (Signed)
Called and spoke with pt stating to him that he needed to contact medical records to get records for life insurance purposes and provided him with their phone number. Nothing further needed.

## 2023-03-19 IMAGING — CT CT HEAD W/O CM
4 series · 16 of 47 positions shown, 18 images · non-contrast
Comparison: None.

CLINICAL DATA: Head trauma. Hit with a tree branch. Loss of
consciousness and vomiting.

EXAM:
CT HEAD WITHOUT CONTRAST
CT CERVICAL SPINE WITHOUT CONTRAST
TECHNIQUE: Multidetector CT imaging of the head and cervical spine was
performed following the standard protocol without intravenous
contrast. Multiplanar CT image reconstructions of the cervical spine
were also generated.

[Series 3: head without · axial · non-contrast · 0.44mm/px · z∈[-104,+21]mm · 7 of 35 slices shown, 9 images]
[im 5/35  brain]
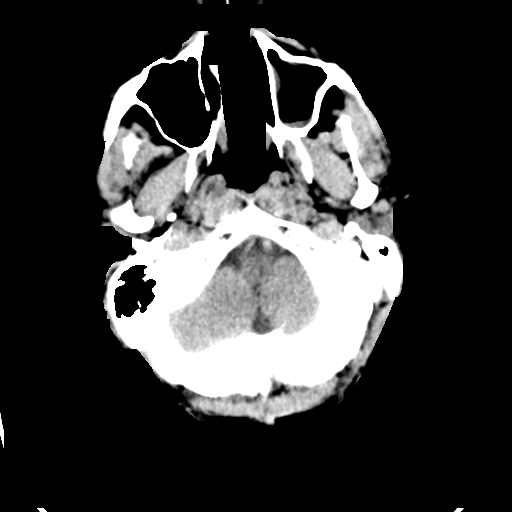
[im 5/35  bone]
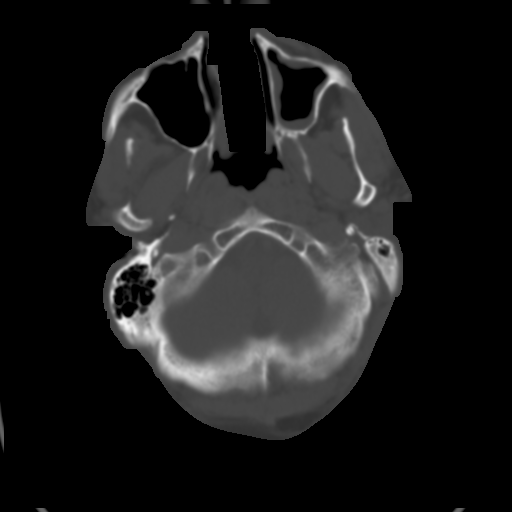
[im 9/35  brain]
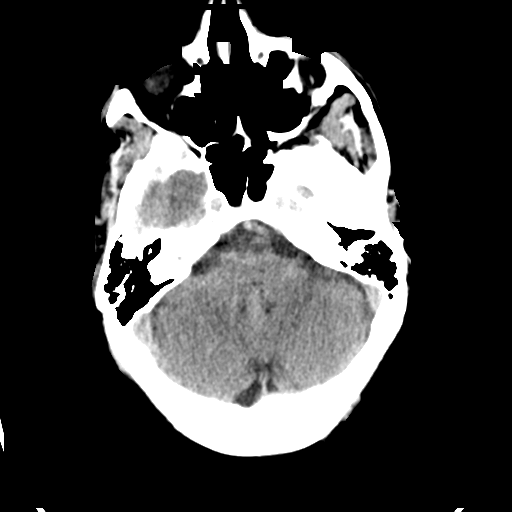
[im 13/35  brain]
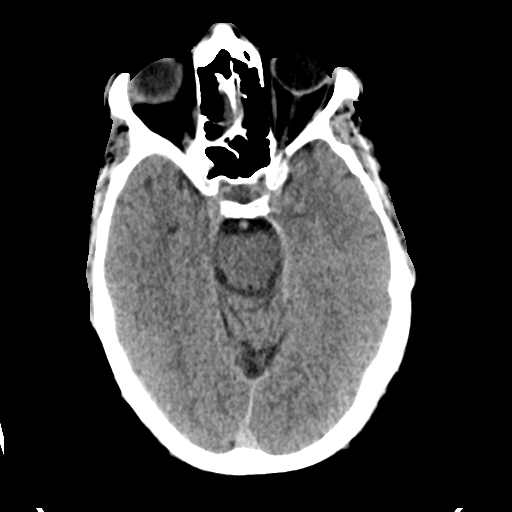
[im 18/35  brain]
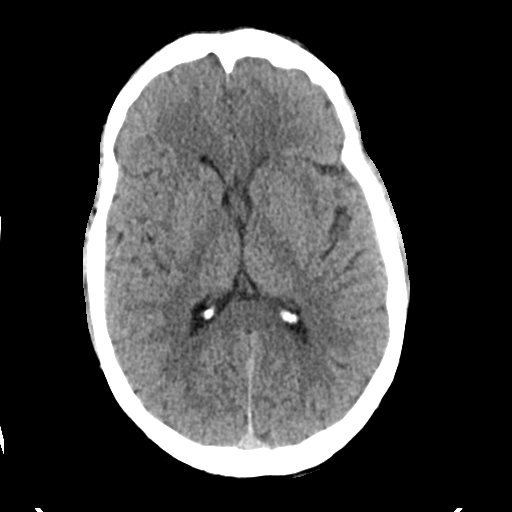
[im 22/35  brain]
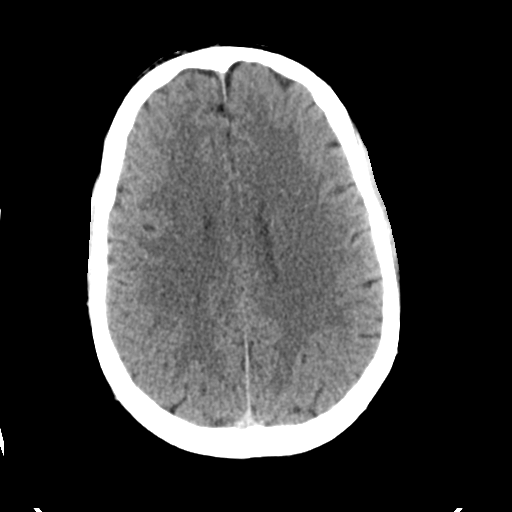
[im 22/35  bone]
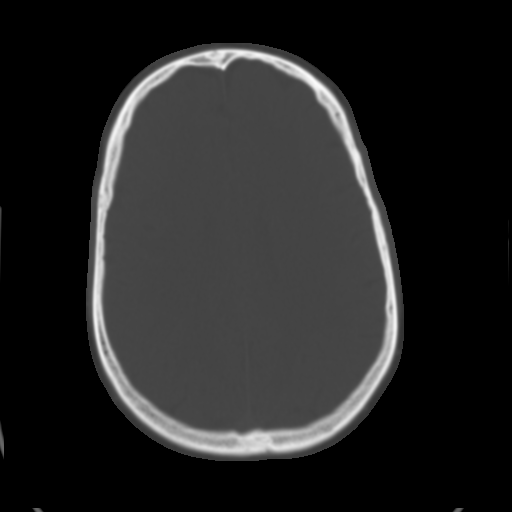
[im 26/35  brain]
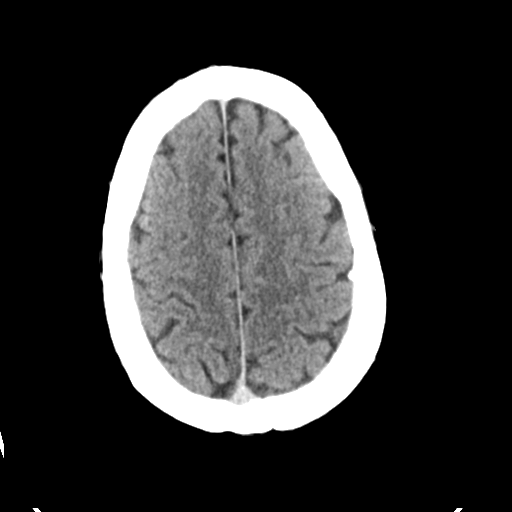
[im 30/35  brain]
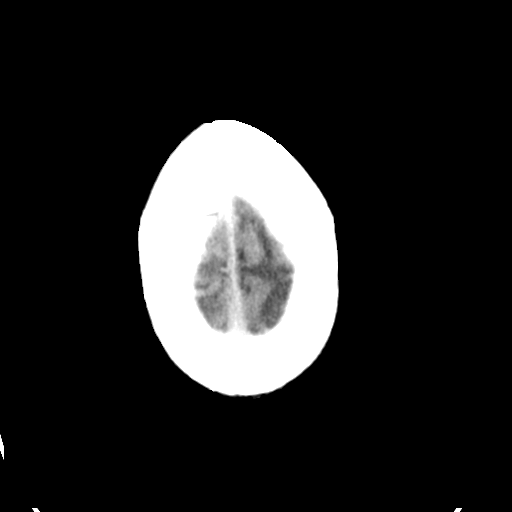

[Series 4: head bone · axial · 0.44mm/px · z∈[-108,-74]mm · 3 of 86 slices shown]
[im 9/86  bone]
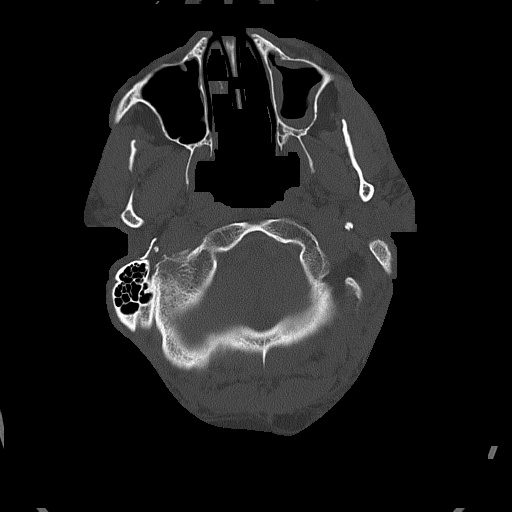
[im 18/86  bone]
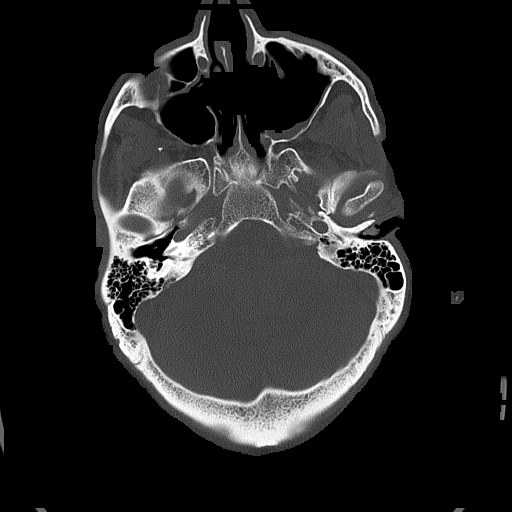
[im 26/86  bone]
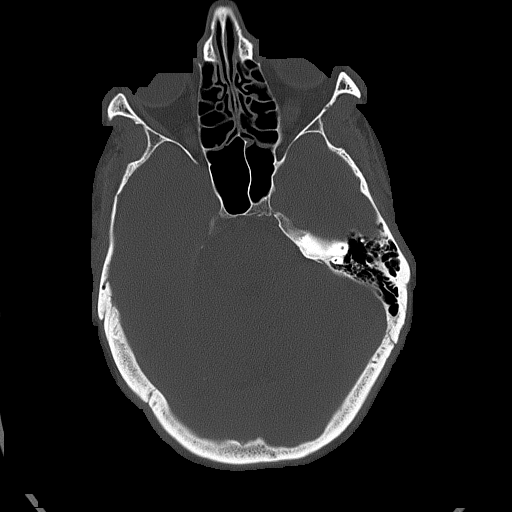

[Series 5: head without cor · coronal · non-contrast · 0.33mm/px · 3 of 75 slices shown]
[im 25/75  brain]
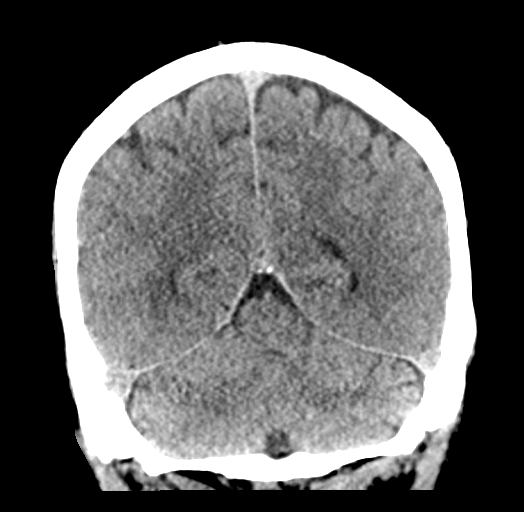
[im 33/75  brain]
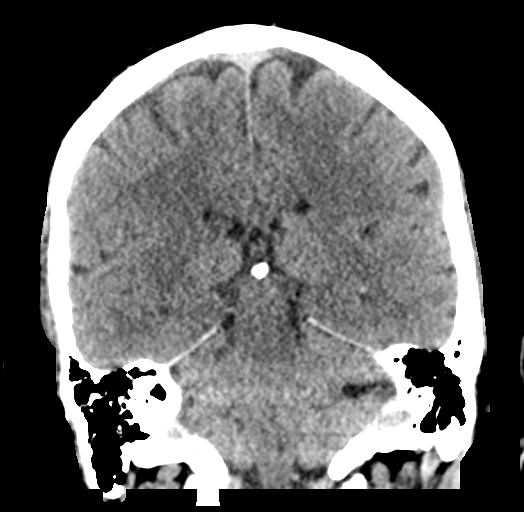
[im 42/75  brain]
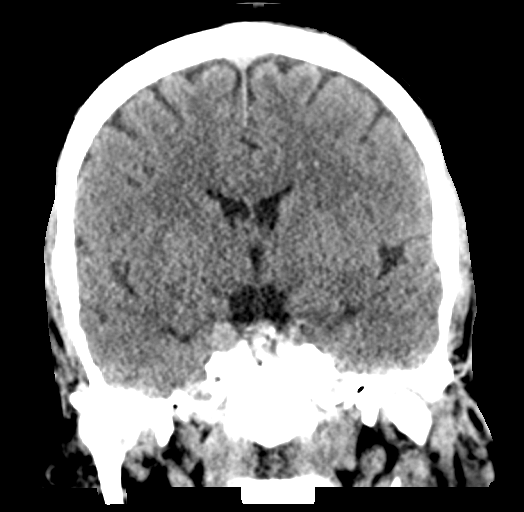

[Series 6: head without sag · sagittal · non-contrast · 0.33mm/px · 3 of 59 slices shown]
[im 20/59  brain]
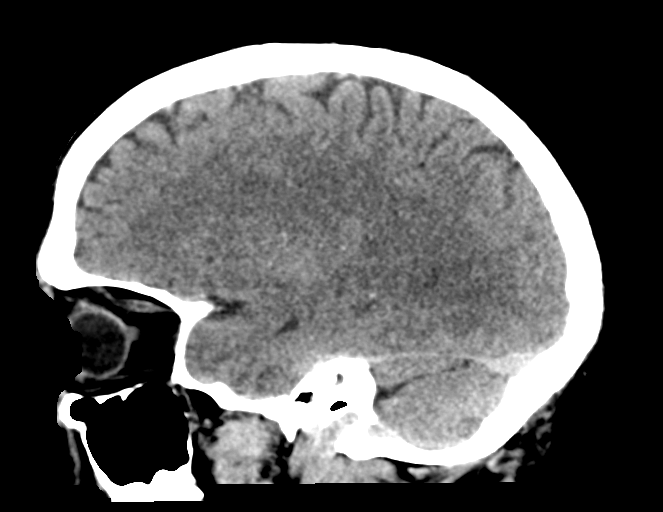
[im 30/59  brain]
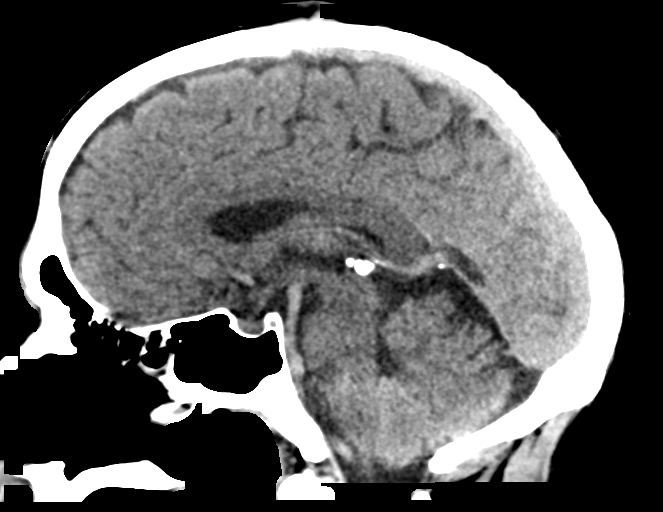
[im 39/59  brain]
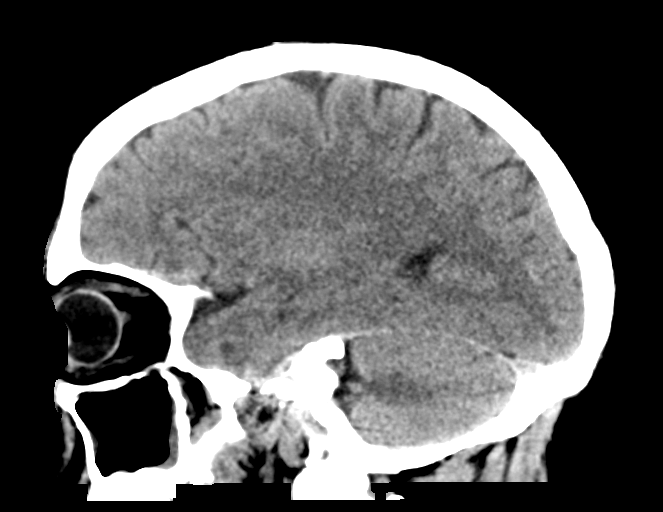

[16 of 47 positions shown; findings below may reference images not displayed]

FINDINGS: CT HEAD FINDINGS

Brain: There is no evidence of an acute infarct, intracranial
hemorrhage, mass, midline shift, or extra-axial fluid collection.
The ventricles and sulci are normal.

Vascular: No suspicious focal vessel hyperdensity.

Skull: No fracture or suspicious osseous lesion.

Sinuses/Orbits: Mild mucosal thickening in the maxillary sinuses.
Clear mastoid air cells. Unremarkable orbits.

Other: None.

CT CERVICAL SPINE FINDINGS

Alignment: Cervical spine straightening.  No listhesis.

Skull base and vertebrae: No acute fracture or suspicious osseous
lesion. Mild median C1-2 arthropathy.

Soft tissues and spinal canal: No prevertebral fluid or swelling. No
visible canal hematoma.

Disc levels: Mild disc degeneration at C4-5 and C6-7. Severe right
facet arthrosis at C7-T1. No evidence of high-grade spinal canal or
neural foraminal stenosis.

Upper chest: Clear lung apices.

Other: None.
IMPRESSION: 1. No evidence of acute intracranial abnormality.
2. No evidence of acute fracture or traumatic subluxation in the
cervical spine.

## 2023-03-19 IMAGING — DX DG CHEST 1V PORT
1 series · 1 of 1 positions shown · non-contrast
Comparison: None.

CLINICAL DATA: Trauma.  Tree fell on head and shoulder.

EXAM:
PORTABLE CHEST 1 VIEW

[chest ap]
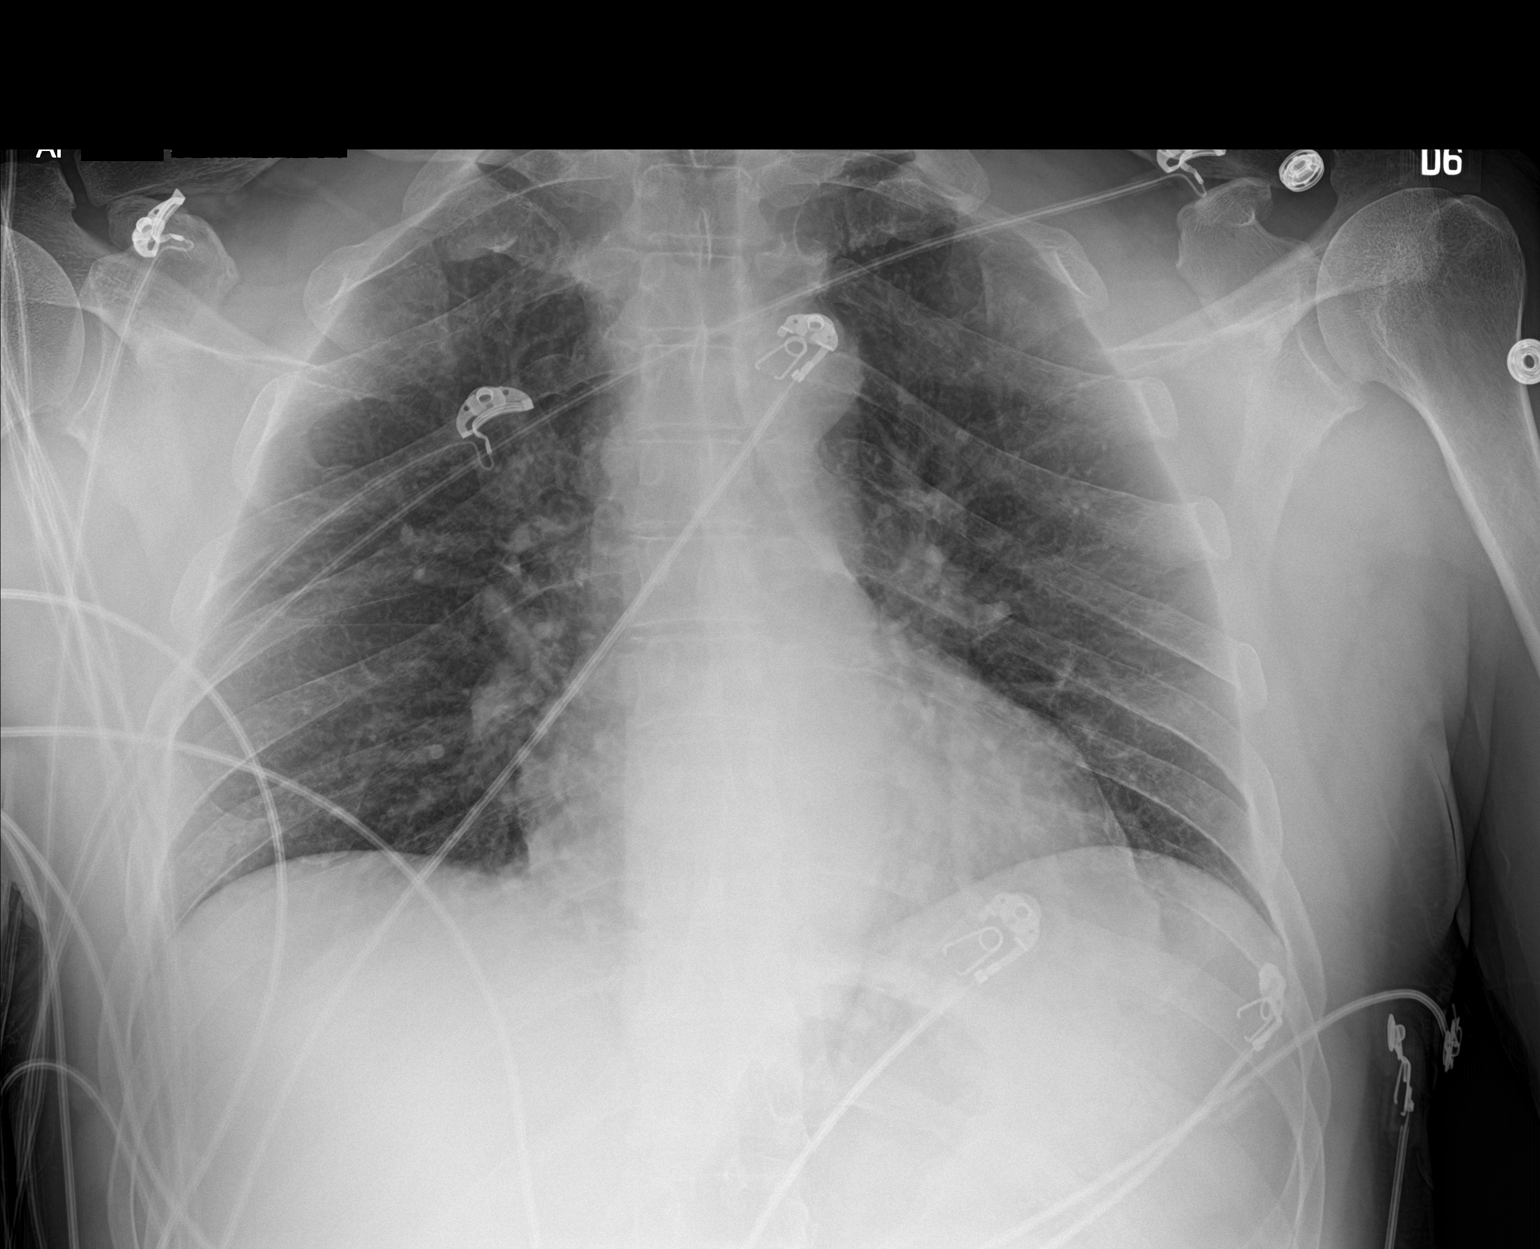

[1 of 1 positions shown; findings below may reference images not displayed]

FINDINGS: Shallow inspiration. Heart size and pulmonary vascularity are
normal. Lungs are clear. No pleural effusions. No pneumothorax.
Mediastinal contours appear intact. Visualized ribs are
nondisplaced.
IMPRESSION: No active disease.
